# Patient Record
Sex: Female | Born: 1995 | Race: Black or African American | Hispanic: No | Marital: Single | Smoking: Never smoker
Health system: Southern US, Community
[De-identification: ages and names within clinical notes are randomized; demographics above are authoritative.]

---

## 2016-09-19 ENCOUNTER — Encounter (HOSPITAL_COMMUNITY): Payer: Self-pay

## 2016-09-19 ENCOUNTER — Emergency Department (HOSPITAL_COMMUNITY)
Admission: EM | Admit: 2016-09-19 | Discharge: 2016-09-19 | Disposition: A | Discharge status: Home / Self Care | Payer: No Typology Code available for payment source | Attending: Emergency Medicine | Admitting: Emergency Medicine

## 2016-09-19 ENCOUNTER — Emergency Department (HOSPITAL_COMMUNITY): Payer: No Typology Code available for payment source

## 2016-09-19 DIAGNOSIS — S161XXA Strain of muscle, fascia and tendon at neck level, initial encounter: Secondary | ICD-10-CM | POA: Diagnosis not present

## 2016-09-19 DIAGNOSIS — Y999 Unspecified external cause status: Secondary | ICD-10-CM | POA: Diagnosis not present

## 2016-09-19 DIAGNOSIS — Y92410 Unspecified street and highway as the place of occurrence of the external cause: Secondary | ICD-10-CM | POA: Insufficient documentation

## 2016-09-19 DIAGNOSIS — S39012A Strain of muscle, fascia and tendon of lower back, initial encounter: Secondary | ICD-10-CM | POA: Diagnosis not present

## 2016-09-19 DIAGNOSIS — S3992XA Unspecified injury of lower back, initial encounter: Secondary | ICD-10-CM | POA: Diagnosis present

## 2016-09-19 DIAGNOSIS — R51 Headache: Secondary | ICD-10-CM | POA: Diagnosis not present

## 2016-09-19 DIAGNOSIS — Y9389 Activity, other specified: Secondary | ICD-10-CM | POA: Insufficient documentation

## 2016-09-19 LAB — POC URINE PREG, ED: PREG TEST UR: NEGATIVE

## 2016-09-19 MED ORDER — NAPROXEN 375 MG PO TABS: 375.0000 mg | ORAL_TABLET | Freq: Two times a day (BID) | ORAL | 0 refills | Status: AC
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
## 2016-09-19 NOTE — ED Notes (Signed)
Patient transported to X-ray 

## 2017-08-24 ENCOUNTER — Emergency Department (HOSPITAL_COMMUNITY)
Admission: EM | Admit: 2017-08-24 | Discharge: 2017-08-24 | Disposition: A | Discharge status: Home / Self Care | Payer: BLUE CROSS/BLUE SHIELD | Attending: Emergency Medicine | Admitting: Emergency Medicine

## 2017-08-24 ENCOUNTER — Encounter (HOSPITAL_COMMUNITY): Payer: Self-pay | Admitting: *Deleted

## 2017-08-24 ENCOUNTER — Emergency Department (HOSPITAL_COMMUNITY): Payer: BLUE CROSS/BLUE SHIELD

## 2017-08-24 ENCOUNTER — Other Ambulatory Visit: Payer: Self-pay

## 2017-08-24 DIAGNOSIS — S301XXA Contusion of abdominal wall, initial encounter: Secondary | ICD-10-CM

## 2017-08-24 DIAGNOSIS — R1012 Left upper quadrant pain: Secondary | ICD-10-CM | POA: Diagnosis not present

## 2017-08-24 LAB — I-STAT CHEM 8, ED
BUN: 3 mg/dL — AB (ref 6–20)
CREATININE: 0.8 mg/dL (ref 0.44–1.00)
Calcium, Ion: 1.24 mmol/L (ref 1.15–1.40)
Chloride: 103 mmol/L (ref 101–111)
GLUCOSE: 92 mg/dL (ref 65–99)
HEMATOCRIT: 39 % (ref 36.0–46.0)
Hemoglobin: 13.3 g/dL (ref 12.0–15.0)
Potassium: 4.1 mmol/L (ref 3.5–5.1)
Sodium: 140 mmol/L (ref 135–145)
TCO2: 27 mmol/L (ref 22–32)

## 2017-08-24 LAB — CBC WITH DIFFERENTIAL/PLATELET
Basophils Absolute: 0 10*3/uL (ref 0.0–0.1)
Basophils Relative: 0 %
EOS PCT: 1 %
Eosinophils Absolute: 0.1 10*3/uL (ref 0.0–0.7)
HCT: 38 % (ref 36.0–46.0)
Hemoglobin: 13.2 g/dL (ref 12.0–15.0)
LYMPHS ABS: 2.2 10*3/uL (ref 0.7–4.0)
LYMPHS PCT: 39 %
MCH: 32.3 pg (ref 26.0–34.0)
MCHC: 34.7 g/dL (ref 30.0–36.0)
MCV: 92.9 fL (ref 78.0–100.0)
MONO ABS: 0.4 10*3/uL (ref 0.1–1.0)
MONOS PCT: 6 %
Neutro Abs: 3.1 10*3/uL (ref 1.7–7.7)
Neutrophils Relative %: 54 %
Platelets: 267 10*3/uL (ref 150–400)
RBC: 4.09 MIL/uL (ref 3.87–5.11)
RDW: 12.4 % (ref 11.5–15.5)
WBC: 5.8 10*3/uL (ref 4.0–10.5)

## 2017-08-24 LAB — I-STAT BETA HCG BLOOD, ED (MC, WL, AP ONLY): I-stat hCG, quantitative: <5 m[IU]/mL (ref <5)

## 2017-08-24 MED ORDER — IOPAMIDOL (ISOVUE-300) INJECTION 61%
INTRAVENOUS | Status: AC
  Administered 2017-08-24: 100 mL
  Filled 2017-08-24: qty 100

## 2017-08-24 MED ORDER — ALBUTEROL (5 MG/ML) CONTINUOUS INHALATION SOLN: 10.0000 mg/h | INHALATION_SOLUTION | Freq: Once | RESPIRATORY_TRACT | Status: DC

## 2017-08-24 MED ORDER — SODIUM CHLORIDE 0.9 % IV BOLUS (SEPSIS)
1000.0000 mL | Freq: Once | INTRAVENOUS | Status: AC
  Administered 2017-08-24: 1000 mL via INTRAVENOUS

## 2017-08-24 NOTE — Discharge Instructions (Signed)
Ibuprofen 600 mg every 6 hours as needed for pain.  Rest.  Up with your primary doctor if not improving in the next 3-4 days.

## 2017-08-24 NOTE — ED Notes (Signed)
Patient transported to CT 

## 2017-08-24 NOTE — ED Notes (Signed)
Nurse drawing labs. 
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
## 2017-09-08 ENCOUNTER — Emergency Department (HOSPITAL_COMMUNITY): Payer: BLUE CROSS/BLUE SHIELD

## 2017-09-08 ENCOUNTER — Encounter (HOSPITAL_COMMUNITY): Payer: Self-pay | Admitting: Emergency Medicine

## 2017-09-08 ENCOUNTER — Other Ambulatory Visit: Payer: Self-pay

## 2017-09-08 ENCOUNTER — Emergency Department (HOSPITAL_COMMUNITY)
Admission: EM | Admit: 2017-09-08 | Discharge: 2017-09-08 | Disposition: A | Discharge status: Home / Self Care | Payer: BLUE CROSS/BLUE SHIELD | Attending: Emergency Medicine | Admitting: Emergency Medicine

## 2017-09-08 DIAGNOSIS — Z79899 Other long term (current) drug therapy: Secondary | ICD-10-CM | POA: Insufficient documentation

## 2017-09-08 DIAGNOSIS — Z041 Encounter for examination and observation following transport accident: Secondary | ICD-10-CM | POA: Insufficient documentation

## 2017-09-08 DIAGNOSIS — R079 Chest pain, unspecified: Secondary | ICD-10-CM | POA: Diagnosis present

## 2017-09-08 LAB — COMPREHENSIVE METABOLIC PANEL
ALBUMIN: 3.5 g/dL (ref 3.5–5.0)
ALT: 20 U/L (ref 14–54)
ANION GAP: 10 (ref 5–15)
AST: 27 U/L (ref 15–41)
Alkaline Phosphatase: 75 U/L (ref 38–126)
BUN: 6 mg/dL (ref 6–20)
CHLORIDE: 109 mmol/L (ref 101–111)
CO2: 21 mmol/L — AB (ref 22–32)
Calcium: 8.9 mg/dL (ref 8.9–10.3)
Creatinine, Ser: 0.8 mg/dL (ref 0.44–1.00)
GFR calc Af Amer: >60 mL/min (ref >60)
GFR calc non Af Amer: >60 mL/min (ref >60)
GLUCOSE: 106 mg/dL — AB (ref 65–99)
POTASSIUM: 3.7 mmol/L (ref 3.5–5.1)
SODIUM: 140 mmol/L (ref 135–145)
Total Bilirubin: 0.6 mg/dL (ref 0.3–1.2)
Total Protein: 6.6 g/dL (ref 6.5–8.1)

## 2017-09-08 LAB — URINALYSIS, ROUTINE W REFLEX MICROSCOPIC
BACTERIA UA: NONE SEEN
Bilirubin Urine: NEGATIVE
Glucose, UA: NEGATIVE mg/dL
KETONES UR: NEGATIVE mg/dL
Leukocytes, UA: NEGATIVE
Nitrite: NEGATIVE
PROTEIN: NEGATIVE mg/dL
Specific Gravity, Urine: 1.027 (ref 1.005–1.030)
pH: 6 (ref 5.0–8.0)

## 2017-09-08 LAB — I-STAT TROPONIN, ED: TROPONIN I, POC: 0 ng/mL (ref 0.00–0.08)

## 2017-09-08 LAB — CBC
HEMATOCRIT: 36.2 % (ref 36.0–46.0)
HEMOGLOBIN: 12.6 g/dL (ref 12.0–15.0)
MCH: 32 pg (ref 26.0–34.0)
MCHC: 34.8 g/dL (ref 30.0–36.0)
MCV: 91.9 fL (ref 78.0–100.0)
Platelets: 314 10*3/uL (ref 150–400)
RBC: 3.94 MIL/uL (ref 3.87–5.11)
RDW: 12 % (ref 11.5–15.5)
WBC: 5.8 10*3/uL (ref 4.0–10.5)

## 2017-09-08 LAB — I-STAT BETA HCG BLOOD, ED (MC, WL, AP ONLY)
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
## 2017-09-08 NOTE — Discharge Instructions (Signed)
Try to take scheduled motrin every 8 hours.  Can add doses of tylenol in between for added relief. Follow-up with your primary care doctor. Return to the ED for new or worsening symptoms.
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
